# Patient Record
Sex: Female | Born: 1940 | Race: White | Smoking: Former smoker
Health system: Southern US, Community
[De-identification: ages and names within clinical notes are randomized; demographics above are authoritative.]

## PROBLEM LIST (undated history)

## (undated) DIAGNOSIS — J449 Chronic obstructive pulmonary disease, unspecified: Secondary | ICD-10-CM

## (undated) DIAGNOSIS — I10 Essential (primary) hypertension: Secondary | ICD-10-CM

## (undated) HISTORY — PX: NECK SURGERY: SHX720

## (undated) HISTORY — DX: Chronic obstructive pulmonary disease, unspecified: J44.9

## (undated) HISTORY — PX: CHOLECYSTECTOMY: SHX55

## (undated) HISTORY — PX: BACK SURGERY: SHX140

## (undated) HISTORY — PX: HYSTEROTOMY: SHX1776

## (undated) HISTORY — DX: Essential (primary) hypertension: I10

---

## 2013-01-09 ENCOUNTER — Ambulatory Visit
Admission: RE | Admit: 2013-01-09 | Discharge: 2013-01-09 | Disposition: A | Discharge status: Home / Self Care | Payer: Medicare Other | Source: Ambulatory Visit | Attending: Family Medicine | Admitting: Family Medicine

## 2013-01-09 ENCOUNTER — Other Ambulatory Visit: Payer: Self-pay | Admitting: Family Medicine

## 2013-01-09 DIAGNOSIS — J441 Chronic obstructive pulmonary disease with (acute) exacerbation: Secondary | ICD-10-CM

## 2013-01-09 DIAGNOSIS — M549 Dorsalgia, unspecified: Secondary | ICD-10-CM

## 2013-01-29 ENCOUNTER — Ambulatory Visit (INDEPENDENT_AMBULATORY_CARE_PROVIDER_SITE_OTHER): Payer: Medicare Other | Admitting: Pulmonary Disease

## 2013-01-29 ENCOUNTER — Encounter: Payer: Self-pay | Admitting: Pulmonary Disease

## 2013-01-29 ENCOUNTER — Other Ambulatory Visit: Payer: Medicare Other

## 2013-01-29 VITALS — BP 138/78 | HR 99 | Temp 98.1°F | Ht 62.0 in | Wt 111.0 lb

## 2013-01-29 DIAGNOSIS — J439 Emphysema, unspecified: Secondary | ICD-10-CM

## 2013-01-29 DIAGNOSIS — R5381 Other malaise: Secondary | ICD-10-CM | POA: Insufficient documentation

## 2013-01-29 DIAGNOSIS — J438 Other emphysema: Secondary | ICD-10-CM

## 2013-01-29 DIAGNOSIS — J9610 Chronic respiratory failure, unspecified whether with hypoxia or hypercapnia: Secondary | ICD-10-CM

## 2013-01-29 DIAGNOSIS — J9611 Chronic respiratory failure with hypoxia: Secondary | ICD-10-CM | POA: Insufficient documentation

## 2013-01-29 NOTE — Progress Notes (Deleted)
  Subjective:    Patient ID: Ann Norton, female    DOB: [birth year], 72 y.o.   MRN: [id number]  HPI    Review of Systems  Constitutional: Positive for unexpected weight change. Negative for fever, chills, diaphoresis, activity change, appetite change and fatigue.  HENT: Negative for hearing loss, ear pain, nosebleeds, congestion, sore throat, facial swelling, rhinorrhea, sneezing, mouth sores, trouble swallowing, neck pain, neck stiffness, dental problem, voice change, postnasal drip, sinus pressure, tinnitus and ear discharge.   Eyes: Negative for photophobia, discharge, itching and visual disturbance.  Respiratory: Positive for cough and shortness of breath. Negative for apnea, choking, chest tightness, wheezing and stridor.   Cardiovascular: Negative for chest pain, palpitations and leg swelling.  Gastrointestinal: Negative for nausea, vomiting, abdominal pain, constipation, blood in stool and abdominal distention.  Genitourinary: Negative for dysuria, urgency, frequency, hematuria, flank pain, decreased urine volume and difficulty urinating.  Musculoskeletal: Positive for joint swelling. Negative for myalgias, back pain, arthralgias and gait problem.  Skin: Negative for color change, pallor and rash.  Neurological: Negative for dizziness, tremors, seizures, syncope, speech difficulty, weakness, light-headedness, numbness and headaches.  Hematological: Negative for adenopathy. Does not bruise/bleed easily.  Psychiatric/Behavioral: Positive for dysphoric mood. Negative for confusion, sleep disturbance and agitation. The patient is nervous/anxious.        Objective:   Physical Exam        Assessment & Plan:

## 2013-01-29 NOTE — Assessment & Plan Note (Signed)
She has severe COPD with emphysema.  She is stable on her current inhaler regimen, and would prefer to continue with same.  Will discuss again at her next visit.  Will check alpha 1 anti-trypsin level.  Will discuss option for flu shot again at next visit.

## 2013-01-29 NOTE — Assessment & Plan Note (Signed)
She is to continue 2 liters oxygen 24/7.  Will re-assess her home oxygen needs at next visit.

## 2013-01-29 NOTE — Assessment & Plan Note (Signed)
Encouraged her to maintain her activity level.  Will arrange for referral to pulmonary rehab.

## 2013-01-29 NOTE — Patient Instructions (Signed)
Will arrange for referral to pulmonary rehab Lab test today >> will call with results Follow up in 6 to 8 weeks

## 2013-01-29 NOTE — Progress Notes (Signed)
Chief Complaint  Patient presents with  . Pulmonary Consult    referred by Dr. Tiburcio Pea for COPD    History of Present Illness: Ann Norton is a 72 y.o. female former smoker for evaluation of COPD with emphysema.  She has extensive history of smoking.  She started at age 6 and smoked up to 1.5 ppd.  She quit in April 2014 when she was admitted to hospital with COPD exacerbation.  She was diagnosed with COPD about 4 years ago.  Since her hospital stay she has been using 2 liters oxygen 24/7.  Her daughter is concerned that her activity level has dropped considerably since her hospital admission.  [name] reports that she gets scared about what could happen to her breathing if she does too much.  She is not having cough or sputum recently.  She will get occasional wheeze.  She denies hemoptysis, chest pain, or leg swelling/cramps.  She is from [place] originally.  Her husband was in the [employer], and she lived in several different places.  She was most recently in [place] before her family brought her to [place].  There is no history of tuberculosis exposure.  She had pneumonia in 2013.  She had a kyphoplasty done in July, and her back pain is improving.  She uses a walker to assist with ambulation.  She was last on prednisone in April.  Her father was diagnosed with emphysema when he was 73 years old.   She has been using advair for years.  She has combivent, but this causes palpitations.  She uses xopenex 2 to 3 times per day, and this helps.  She tried spiriva for a few weeks >> she stopped this because she didn't think it was helping, and she thinks this caused her to get thrush.  She had pneumonia shot in April 2014.  Her last flu shot was in the 1970's >> she had a reaction, and decide to not have flu shots again.  Tests:   Jannie Doyle  has a past medical history of Hypertension and COPD (chronic obstructive pulmonary disease).  Amisadai Woodford  has past surgical  history that includes Cholecystectomy; Hysterotomy; Neck surgery; and Back surgery.  Prior to Admission medications   Medication Sig Start Date End Date Taking? Authorizing Provider  amLODipine (NORVASC) 5 MG tablet Take 5 mg by mouth daily.   Yes Historical Provider, MD  aspirin 81 MG tablet Take 81 mg by mouth daily.   Yes Historical Provider, MD  Calcium Carbonate (CALTRATE 600 PO) Take 2 tablets by mouth 2 (two) times daily.   Yes Historical Provider, MD  esomeprazole (NEXIUM) 40 MG capsule Take 40 mg by mouth daily before breakfast.   Yes Historical Provider, MD  Fluticasone-Salmeterol (ADVAIR) 500-50 MCG/DOSE AEPB Inhale 1 puff into the lungs every 12 (twelve) hours.   Yes Historical Provider, MD  gabapentin (NEURONTIN) 300 MG capsule Take 300 mg by mouth daily.   Yes Historical Provider, MD  hydrochlorothiazide (MICROZIDE) 12.5 MG capsule Take 12.5 mg by mouth daily.   Yes Historical Provider, MD  Ipratropium-Albuterol (COMBIVENT RESPIMAT) 20-100 MCG/ACT AERS respimat Inhale 1 puff into the lungs as needed for wheezing.   Yes Historical Provider, MD  levalbuterol [facility] HFA) 45 MCG/ACT inhaler Inhale 1-2 puffs into the lungs every 6 (six) hours as needed for wheezing.   Yes Historical Provider, MD  levalbuterol (XOPENEX) 1.25 MG/3ML nebulizer solution Take 1.25 mg by nebulization every 4 (four) hours as needed for wheezing.  Yes Historical Provider, MD  PARoxetine (PAXIL) 20 MG tablet Take 20 mg by mouth every morning.   Yes Historical Provider, MD    Allergies  Allergen Reactions  . Penicillins     Her family history includes Asthma in her father and COPD in her father.  She  reports that she quit smoking about 4 months ago. Her smoking use included Cigarettes. She has a 55 pack-year smoking history. She does not have any smokeless tobacco history on file. She reports that she does not drink alcohol or use illicit drugs.  Review of Systems  Constitutional: Positive for  unexpected weight change. Negative for fever, chills, diaphoresis, activity change, appetite change and fatigue.  HENT: Negative for hearing loss, ear pain, nosebleeds, congestion, sore throat, facial swelling, rhinorrhea, sneezing, mouth sores, trouble swallowing, neck pain, neck stiffness, dental problem, voice change, postnasal drip, sinus pressure, tinnitus and ear discharge.   Eyes: Negative for photophobia, discharge, itching and visual disturbance.  Respiratory: Positive for cough and shortness of breath. Negative for apnea, choking, chest tightness, wheezing and stridor.   Cardiovascular: Negative for chest pain, palpitations and leg swelling.  Gastrointestinal: Negative for nausea, vomiting, abdominal pain, constipation, blood in stool and abdominal distention.  Genitourinary: Negative for dysuria, urgency, frequency, hematuria, flank pain, decreased urine volume and difficulty urinating.  Musculoskeletal: Positive for joint swelling. Negative for myalgias, back pain, arthralgias and gait problem.  Skin: Negative for color change, pallor and rash.  Neurological: Negative for dizziness, tremors, seizures, syncope, speech difficulty, weakness, light-headedness, numbness and headaches.  Hematological: Negative for adenopathy. Does not bruise/bleed easily.  Psychiatric/Behavioral: Positive for dysphoric mood. Negative for confusion, sleep disturbance and agitation. The patient is nervous/anxious.    Physical Exam:  General - No distress, wearing oxygen, thin ENT - No sinus tenderness, no oral exudate, no LAN, no thyromegaly, TM clear, pupils equal/reactive Cardiac - s1s2 regular, no murmur, pulses symmetric Chest - barrel chest, prolonged exhalation, decreased breath sounds, no wheeze/rales Back - No focal tenderness Abd - Soft, non-tender, no organomegaly, + bowel sounds Ext - No edema Neuro - Normal strength, cranial nerves intact Skin - No rashes Psych - Normal mood, and  behavior   Dg Chest 2 View  01/09/2013   *RADIOLOGY REPORT*  Clinical Data: COPD and back pain.  CHEST - 2 VIEW  Comparison: None  Findings: Lungs are moderately hyperinflated.  Emphysematous changes are present.  No evidence of edema, nodule, infiltrate or pneumothorax.  No pleural fluid is identified.  The heart size is at the upper limits of normal.  Bony thorax shows osteopenia and prior vertebroplasty in the lower thoracic spine.  IMPRESSION: COPD.  No acute findings.   Original Report Authenticated By: Irish Lack, M.D.    Assessment/Plan:  Coralyn Helling, MD [facility] Pager:  [phone]

## 2013-01-30 ENCOUNTER — Telehealth: Payer: Self-pay | Admitting: *Deleted

## 2013-01-30 NOTE — Telephone Encounter (Signed)
Message copied by Caryl Ada on Wed Jan 30, 2013  1:24 PM ------      Message from: Coralyn Helling      Created: Wed Jan 30, 2013 10:45 AM       Please call to schedule her for PFT.            Thanks.            ----- Message -----         From: Caryl Ada, CMA         Sent: 01/30/2013   9:56 AM           To: Coralyn Helling, MD            I called Dr. Tiburcio Pea' office and they state that they do not have a PFT on her.      ----- Message -----         From: Coralyn Helling, MD         Sent: 01/30/2013   7:36 AM           To: Caryl Ada, CMA            Please see if we can get recent PFT from her PCP's office.  Thanks.             ------

## 2013-01-30 NOTE — Telephone Encounter (Signed)
Spoke to pt. She is going to call us back and schedule the PFT.

## 2013-02-02 LAB — ALPHA-1 ANTITRYPSIN PHENOTYPE: A-1 Antitrypsin: 191 mg/dL (ref 83–199)

## 2013-02-14 ENCOUNTER — Telehealth: Payer: Self-pay | Admitting: Pulmonary Disease

## 2013-02-14 NOTE — Telephone Encounter (Signed)
A1AT 01/29/13 >> 191, [id number]  Will have my nurse inform pt that lab test normal.  She does not have inherited form of emphysema.

## 2013-02-18 NOTE — Telephone Encounter (Signed)
lmtcb

## 2013-02-19 ENCOUNTER — Telehealth: Payer: Self-pay | Admitting: Pulmonary Disease

## 2013-02-19 NOTE — Telephone Encounter (Signed)
Pt is aware of results. 

## 2013-02-19 NOTE — Telephone Encounter (Signed)
Spoke to [place]. Advised her that we have the staff that day when to pt comes to do qualifying sats it should not be a problem. She agreed and verbalized understanding.

## 2013-02-19 NOTE — Telephone Encounter (Signed)
lmtcb

## 2013-03-01 ENCOUNTER — Ambulatory Visit (INDEPENDENT_AMBULATORY_CARE_PROVIDER_SITE_OTHER): Payer: Medicare Other | Admitting: Pulmonary Disease

## 2013-03-01 DIAGNOSIS — J439 Emphysema, unspecified: Secondary | ICD-10-CM

## 2013-03-01 DIAGNOSIS — J438 Other emphysema: Secondary | ICD-10-CM

## 2013-03-01 LAB — PULMONARY FUNCTION TEST

## 2013-03-01 NOTE — Progress Notes (Signed)
Spirometry before and after done today.  Pt was unable to perform DLCO and lung volumes.

## 2013-03-04 ENCOUNTER — Telehealth: Payer: Self-pay | Admitting: Pulmonary Disease

## 2013-03-04 NOTE — Telephone Encounter (Signed)
lmtcb

## 2013-03-04 NOTE — Telephone Encounter (Signed)
PFT 03/01/13 >> FEV1 0.48, FEV1% 36, positive BD.  Will have my nurse inform pt that PFT shows severe COPD >> this is as expected.  She is to continue her current inhaler regimen.

## 2013-03-04 NOTE — Telephone Encounter (Signed)
Spoke with patient made her aware of results and recs per Dr. Craige Cotta as listed below Patient verbalized understanding and nothing further needed at this time.

## 2013-03-04 NOTE — Telephone Encounter (Signed)
PFT 03/01/13 >> FEV1 0.48, FEV1% 36, positive BD.  Will have my nurse inform pt that PFT shows severe COPD >> this is as expected. She is to continue her current inhaler regimen   ------- Spoke with patient made her aware of results and recs per Dr. Craige Cotta as listed above Patient verbalized understanding and nothing further needed at this time.

## 2013-03-18 ENCOUNTER — Encounter: Payer: Self-pay | Admitting: Pulmonary Disease

## 2013-03-25 ENCOUNTER — Telehealth: Payer: Self-pay | Admitting: Pulmonary Disease

## 2013-03-25 NOTE — Telephone Encounter (Signed)
I spoke with pt daughter. She stated pt received a letter from [facility] stating they have not been able to get qualifying O2 sats from our office and will bill pt.  I called and spoke with [facility]. She spoke with lindsay on 02/19/13 and advised melissa when pt came in for PFT we would be able to obtain sats on pt. Does not look like this was done. Melissa stated she is going to check with her resp team and verify they havent received anything and call back. Will await call back  Pt also is due for OV with VS.

## 2013-03-27 NOTE — Telephone Encounter (Signed)
Returning call can be reached at [phone].Ann Norton

## 2013-03-27 NOTE — Telephone Encounter (Signed)
I spoke with daughter and made her aware. She voiced her understanding and needed nothing further needed

## 2013-03-27 NOTE — Telephone Encounter (Signed)
Called, spoke with Melissa to follow up on this.  Melissa states o2 order was originially ordered by Dr. Earl Gala.  Per Efraim Kaufmann, they are going to get the needed info from Dr. Newell Coral office.  Nothing further is needed from our office at this time.  I have lmomtcb for Lupita Leash to inform her of this and see if they would like to schedule f/u with VS.

## 2013-04-09 ENCOUNTER — Telehealth: Payer: Self-pay | Admitting: Pulmonary Disease

## 2013-04-09 NOTE — Telephone Encounter (Signed)
I have only evaluated her once for initial consultation visit.  Would be best to have this set up through her PCP.

## 2013-04-09 NOTE — Telephone Encounter (Signed)
Will forward to VS and his nurse to advise.

## 2013-04-10 NOTE — Telephone Encounter (Signed)
I called and made [facility] aware

## 2013-04-22 ENCOUNTER — Telehealth (HOSPITAL_COMMUNITY): Payer: Self-pay | Admitting: *Deleted

## 2013-11-25 DEATH — deceased

## 2013-12-03 ENCOUNTER — Telehealth: Payer: Self-pay

## 2013-12-03 NOTE — Telephone Encounter (Signed)
Patient past away @ [facility]

## 2015-05-19 IMAGING — CR DG THORACIC SPINE 3V
3 series · 3 of 3 positions shown · non-contrast
Comparison: None.

CLINICAL DATA: Back pain.

THORACIC SPINE - 2 VIEW + SWIMMERS

[view not recorded (1 of 3)]
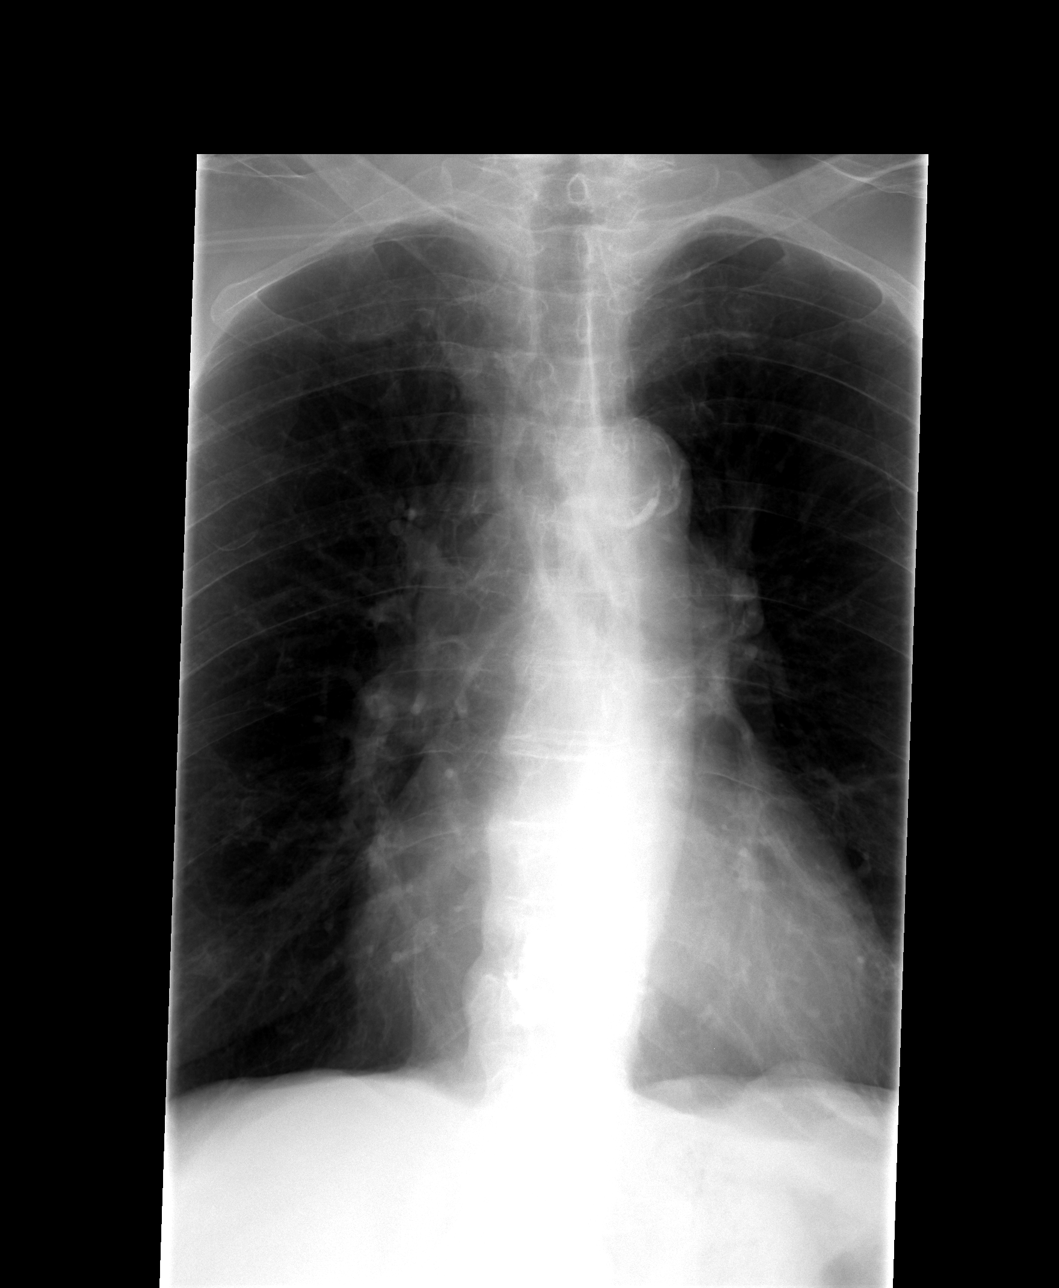

[view not recorded (2 of 3)]
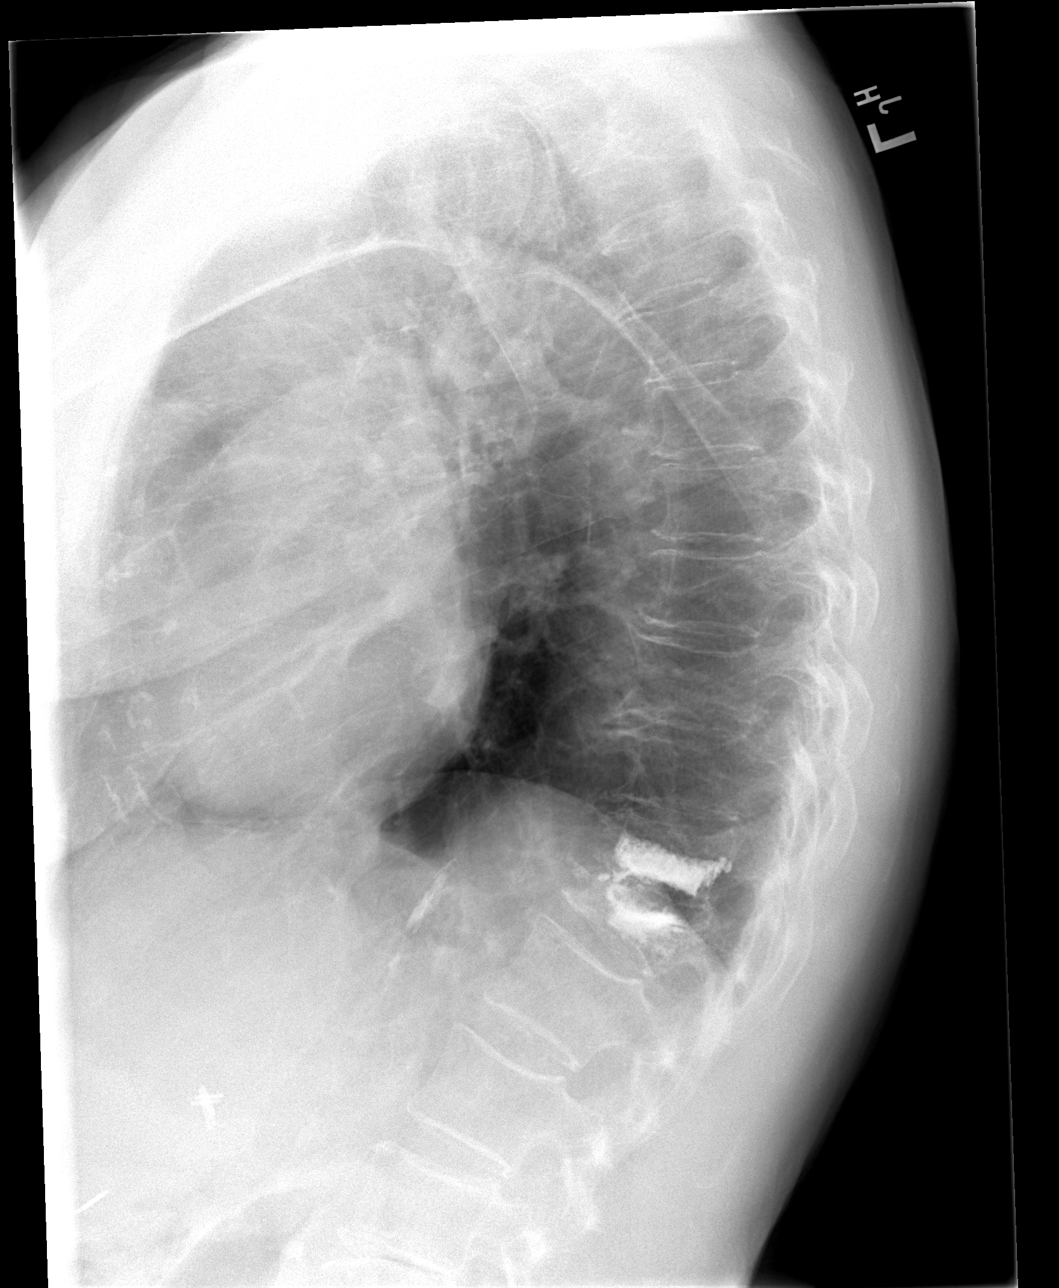

[view not recorded (3 of 3)]
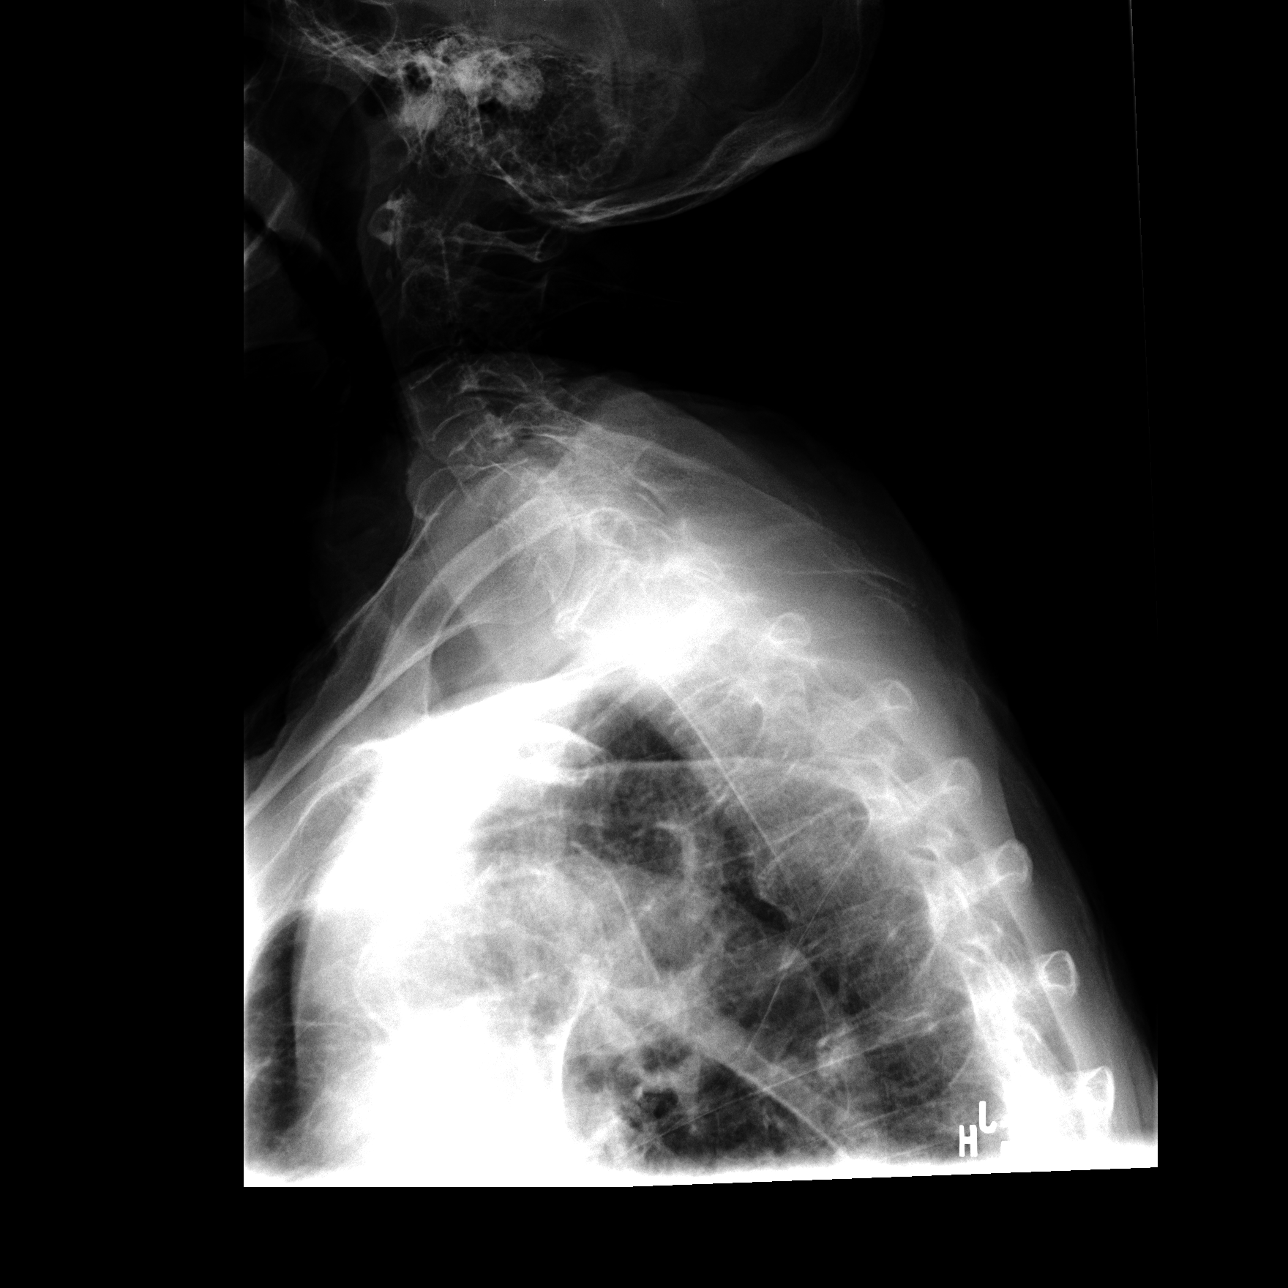

[3 of 3 positions shown; findings below may reference images not displayed]

FINDINGS: Methylmethacrylate seen in the T11 and T12 vertebral
bodies related to prior vertebral augmentation.  No acute fractures
identified.  Bones are osteopenic.  Mild osteophytes are seen
throughout the thoracic spine.
IMPRESSION: No acute findings.
